# Patient Record
Sex: Female | Born: 1960 | Race: White | Hispanic: No | Marital: Single | Smoking: Never smoker
Health system: Southern US, Community
[De-identification: ages and names within clinical notes are randomized; demographics above are authoritative.]

## PROBLEM LIST (undated history)

## (undated) DIAGNOSIS — T7840XA Allergy, unspecified, initial encounter: Secondary | ICD-10-CM

## (undated) DIAGNOSIS — F329 Major depressive disorder, single episode, unspecified: Secondary | ICD-10-CM

## (undated) DIAGNOSIS — F419 Anxiety disorder, unspecified: Secondary | ICD-10-CM

## (undated) DIAGNOSIS — M1990 Unspecified osteoarthritis, unspecified site: Secondary | ICD-10-CM

## (undated) DIAGNOSIS — F32A Depression, unspecified: Secondary | ICD-10-CM

## (undated) DIAGNOSIS — D649 Anemia, unspecified: Secondary | ICD-10-CM

## (undated) DIAGNOSIS — H269 Unspecified cataract: Secondary | ICD-10-CM

## (undated) HISTORY — PX: POLYPECTOMY: SHX149

## (undated) HISTORY — DX: Major depressive disorder, single episode, unspecified: F32.9

## (undated) HISTORY — PX: MOLE REMOVAL: SHX2046

## (undated) HISTORY — DX: Unspecified cataract: H26.9

## (undated) HISTORY — DX: Anemia, unspecified: D64.9

## (undated) HISTORY — DX: Depression, unspecified: F32.A

## (undated) HISTORY — PX: NO PAST SURGERIES: SHX2092

## (undated) HISTORY — DX: Unspecified osteoarthritis, unspecified site: M19.90

## (undated) HISTORY — DX: Allergy, unspecified, initial encounter: T78.40XA

## (undated) HISTORY — PX: COLONOSCOPY: SHX174

## (undated) HISTORY — PX: WISDOM TOOTH EXTRACTION: SHX21

## (undated) HISTORY — DX: Anxiety disorder, unspecified: F41.9

---

## 2001-11-22 ENCOUNTER — Encounter: Payer: Self-pay | Admitting: Emergency Medicine

## 2001-11-22 ENCOUNTER — Encounter: Admission: RE | Admit: 2001-11-22 | Discharge: 2001-11-22 | Payer: Self-pay | Admitting: Emergency Medicine

## 2002-05-23 ENCOUNTER — Encounter: Payer: Self-pay | Admitting: Emergency Medicine

## 2002-05-23 ENCOUNTER — Encounter: Admission: RE | Admit: 2002-05-23 | Discharge: 2002-05-23 | Payer: Self-pay | Admitting: Emergency Medicine

## 2002-11-27 ENCOUNTER — Encounter: Admission: RE | Admit: 2002-11-27 | Discharge: 2002-11-27 | Payer: Self-pay | Admitting: Emergency Medicine

## 2002-11-27 ENCOUNTER — Encounter: Payer: Self-pay | Admitting: Emergency Medicine

## 2003-12-02 ENCOUNTER — Encounter: Admission: RE | Admit: 2003-12-02 | Discharge: 2003-12-02 | Payer: Self-pay | Admitting: Emergency Medicine

## 2005-01-18 ENCOUNTER — Encounter: Admission: RE | Admit: 2005-01-18 | Discharge: 2005-01-18 | Payer: Self-pay | Admitting: Emergency Medicine

## 2006-05-09 ENCOUNTER — Encounter: Admission: RE | Admit: 2006-05-09 | Discharge: 2006-05-09 | Payer: Self-pay | Admitting: Emergency Medicine

## 2007-07-13 ENCOUNTER — Encounter: Admission: RE | Admit: 2007-07-13 | Discharge: 2007-07-13 | Payer: Self-pay | Admitting: Emergency Medicine

## 2008-08-01 ENCOUNTER — Encounter: Admission: RE | Admit: 2008-08-01 | Discharge: 2008-08-01 | Payer: Self-pay | Admitting: Emergency Medicine

## 2009-10-29 ENCOUNTER — Encounter: Admission: RE | Admit: 2009-10-29 | Discharge: 2009-10-29 | Payer: Self-pay | Admitting: Emergency Medicine

## 2010-11-29 ENCOUNTER — Other Ambulatory Visit: Payer: Self-pay | Admitting: Emergency Medicine

## 2010-11-29 DIAGNOSIS — Z1231 Encounter for screening mammogram for malignant neoplasm of breast: Secondary | ICD-10-CM

## 2010-12-16 ENCOUNTER — Ambulatory Visit
Admission: RE | Admit: 2010-12-16 | Discharge: 2010-12-16 | Disposition: A | Discharge status: Home / Self Care | Payer: Self-pay | Source: Ambulatory Visit | Attending: Emergency Medicine | Admitting: Emergency Medicine

## 2010-12-16 DIAGNOSIS — Z1231 Encounter for screening mammogram for malignant neoplasm of breast: Secondary | ICD-10-CM

## 2010-12-20 ENCOUNTER — Other Ambulatory Visit: Payer: Self-pay | Admitting: Emergency Medicine

## 2010-12-20 DIAGNOSIS — R928 Other abnormal and inconclusive findings on diagnostic imaging of breast: Secondary | ICD-10-CM

## 2010-12-29 ENCOUNTER — Ambulatory Visit
Admission: RE | Admit: 2010-12-29 | Discharge: 2010-12-29 | Disposition: A | Discharge status: Home / Self Care | Payer: 59 | Source: Ambulatory Visit | Attending: Emergency Medicine | Admitting: Emergency Medicine

## 2010-12-29 DIAGNOSIS — R928 Other abnormal and inconclusive findings on diagnostic imaging of breast: Secondary | ICD-10-CM

## 2012-03-07 ENCOUNTER — Other Ambulatory Visit: Payer: Self-pay | Admitting: Emergency Medicine

## 2013-08-19 ENCOUNTER — Other Ambulatory Visit: Payer: Self-pay | Admitting: Family Medicine

## 2013-08-19 ENCOUNTER — Ambulatory Visit (INDEPENDENT_AMBULATORY_CARE_PROVIDER_SITE_OTHER): Payer: 59 | Admitting: Family Medicine

## 2013-08-19 VITALS — BP 118/80 | HR 61 | Temp 97.9°F | Resp 16 | Ht 66.75 in | Wt 191.0 lb

## 2013-08-19 DIAGNOSIS — Z1211 Encounter for screening for malignant neoplasm of colon: Secondary | ICD-10-CM

## 2013-08-19 DIAGNOSIS — D649 Anemia, unspecified: Secondary | ICD-10-CM

## 2013-08-19 DIAGNOSIS — Z0000 Encounter for general adult medical examination without abnormal findings: Secondary | ICD-10-CM

## 2013-08-19 DIAGNOSIS — Z23 Encounter for immunization: Secondary | ICD-10-CM

## 2013-08-19 LAB — POCT CBC
Granulocyte percent: 63.8 %G (ref 37–80)
HCT, POC: 32.8 % — AB (ref 37.7–47.9)
Hemoglobin: 9.9 g/dL — AB (ref 12.2–16.2)
Lymph, poc: 1.5 (ref 0.6–3.4)
MCH, POC: 22.3 pg — AB (ref 27–31.2)
MCHC: 30.2 g/dL — AB (ref 31.8–35.4)
MCV: 74 fL — AB (ref 80–97)
MID (cbc): 0.5 (ref 0–0.9)
MPV: 8.1 fL (ref 0–99.8)
POC Granulocyte: 3.4 (ref 2–6.9)
POC LYMPH PERCENT: 27.7 %L (ref 10–50)
POC MID %: 8.5 %M (ref 0–12)
Platelet Count, POC: 304 10*3/uL (ref 142–424)
RBC: 4.43 M/uL (ref 4.04–5.48)
RDW, POC: 17.4 %
WBC: 5.4 10*3/uL (ref 4.6–10.2)

## 2013-08-19 NOTE — Patient Instructions (Signed)

## 2013-08-19 NOTE — Progress Notes (Addendum)
Subjective:    Patient ID: Gust Brooms, female    DOB: 08-09-1960, 53 y.o.   MRN: [id number] This chart was scribed for Delman Cheadle, MD by Randa Evens, ED Scribe. This Patient was seen in room 13 and the patients care was started at 7:43 PM  Chief Complaint  Patient presents with  . Annual Exam    HPI HPI Comments: MCKAYLIE VASEY is a 53 y.o. female here for a physical.  States she is on Adderall and Leaxapro prescribed by Dr. Albertine Patricia - has seen him for many years.  States parents have h/o skin cancer. She does not have any moles she is concerned about, wears sunscreen, and checks her skin regularly. States father has a h/o Mild CVA at age of 39. Previous chloesterol levels  LDL 58 HDL 75 in 2003.  She does not smoke. She states she exercises 3-5 times a week. With resistance training and cardio for at least 30 min.  Last mammogram 12/2010.  States that she was not able to get a mammogram [facility] wouldn't let her schedule it. PCP is Dr. Everlene Farrier but has not seen him in > 3 yrs.  Her last tetanus shot was 2006.  She states that she is currently taking vitamin D, and calcium.  She denies any h/o abnormal pap smear. She states that she is now postmenopausal. States that she hasn't had menstrual cycle in 7 months. States she is experiencing hot flashes. She has never been sexually active with a female or female.    No past medical history on file. No current outpatient prescriptions on file prior to visit.   No current facility-administered medications on file prior to visit.   No Known Allergies History   Social History  . Marital Status: Single    Spouse Name: N/A    Number of Children: N/A  . Years of Education: N/A   Occupational History  . Not on file.   Social History Main Topics  . Smoking status: Never Smoker   . Smokeless tobacco: Not on file  . Alcohol Use: Not on file  . Drug Use: Not on file  . Sexual Activity: Not  on file   Other Topics Concern  . Not on file   Social History Narrative  . No narrative on file   No family history on file.   Review of Systems  All other systems reviewed and are negative.    Objective:  BP 118/80  Pulse 61  Temp(Src) 97.9 F (36.6 C) (Oral)  Resp 16  Ht 5' 6.75" (1.695 m)  Wt 191 lb (86.637 kg)  BMI 30.16 kg/m2  SpO2 100%  LMP 01/19/2013  Physical Exam  Nursing note and vitals reviewed. Constitutional: She is oriented to person, place, and time. She appears well-developed and well-nourished. No distress.  HENT:  Head: Normocephalic and atraumatic.  Right Ear: Tympanic membrane, external ear and ear canal normal.  Left Ear: Tympanic membrane, external ear and ear canal normal.  Nose: Nose normal. No rhinorrhea.  Mouth/Throat: Uvula is midline, oropharynx is clear and moist and mucous membranes are normal. No posterior oropharyngeal erythema.  Eyes: Conjunctivae and EOM are normal. Pupils are equal, round, and reactive to light. Right eye exhibits no discharge. Left eye exhibits no discharge. No scleral icterus.  Neck: Normal range of motion. Neck supple. No thyromegaly present.  Cardiovascular: Normal rate, regular rhythm, S1 normal, S2 normal, normal heart sounds and  intact distal pulses.   No murmur heard. Pulmonary/Chest: Effort normal and breath sounds normal. No respiratory distress.  Abdominal: Soft. Bowel sounds are normal. There is no tenderness.  Genitourinary: Vagina normal. No breast swelling, tenderness, discharge or bleeding. There is no rash, tenderness or lesion on the right labia. There is no rash, tenderness or lesion on the left labia.  Pt could not tolerate speculum exam. Quick bimanual exam with 1 finger allowed me to palpate anterior edge of cervix but only but pt could not tolerate additional vaginal exam due to pain.  Musculoskeletal: Normal range of motion. She exhibits no edema.  Lymphadenopathy:    She has no cervical  adenopathy.  Neurological: She is alert and oriented to person, place, and time. She has normal reflexes.  Skin: Skin is warm and dry. She is not diaphoretic. No erythema.  Psychiatric: Her speech is normal.   Results for orders placed in visit on 08/19/13  COMPREHENSIVE METABOLIC PANEL      Result Value Ref Range   Sodium 141  135 - 145 mEq/L   Potassium 4.2  3.5 - 5.3 mEq/L   Chloride 107  96 - 112 mEq/L   CO2 26  19 - 32 mEq/L   Glucose, Bld 89  70 - 99 mg/dL   BUN 15  6 - 23 mg/dL   Creat 0.65  0.50 - 1.10 mg/dL   Total Bilirubin 0.2  0.2 - 1.2 mg/dL   Alkaline Phosphatase 55  39 - 117 U/L   AST 22  0 - 37 U/L   ALT 20  0 - 35 U/L   Total Protein 6.0  6.0 - 8.3 g/dL   Albumin 3.9  3.5 - 5.2 g/dL   Calcium 9.3  8.4 - 10.5 mg/dL  LIPID PANEL      Result Value Ref Range   Cholesterol 163  0 - 200 mg/dL   Triglycerides 99  <150 mg/dL   HDL 60  >39 mg/dL   Total CHOL/HDL Ratio 2.7     VLDL 20  0 - 40 mg/dL   LDL Cholesterol 83  0 - 99 mg/dL  TSH      Result Value Ref Range   TSH 1.188  0.350 - 4.500 uIU/mL  POCT CBC      Result Value Ref Range   WBC 5.4  4.6 - 10.2 K/uL   Lymph, poc 1.5  0.6 - 3.4   POC LYMPH PERCENT 27.7  10 - 50 %L   MID (cbc) 0.5  0 - 0.9   POC MID % 8.5  0 - 12 %M   POC Granulocyte 3.4  2 - 6.9   Granulocyte percent 63.8  37 - 80 %G   RBC 4.43  4.04 - 5.48 M/uL   Hemoglobin 9.9 (*) 12.2 - 16.2 g/dL   HCT, POC 32.8 (*) 37.7 - 47.9 %   MCV 74.0 (*) 80 - 97 fL   MCH, POC 22.3 (*) 27 - 31.2 pg   MCHC 30.2 (*) 31.8 - 35.4 g/dL   RDW, POC 17.4     Platelet Count, POC 304  142 - 424 K/uL   MPV 8.1  0 - 99.8 fL  PAP IG AND HPV HIGH-RISK      Result Value Ref Range   HPV DNA High Risk Not Detected     Specimen adequacy:       FINAL DIAGNOSIS:       COMMENTS:  Cytotechnologist:        Assessment & Plan:   Routine general medical examination at a health care facility - Plan: POCT CBC, Comprehensive metabolic panel, Lipid panel, TSH, Pap IG  and HPV (high risk) DNA detection, MM Digital Screening, Tdap vaccine greater than or equal to 7yo IM, CANCELED: Pap IG and HPV (high risk) DNA detection - [facility] now that she has had a CPE. Could not visualize cervix due to narrow vaginal introitus and complete intolerance of speculum exam.  I tried to do bimanual exam with only 1 finger but could only palpate anterior lip of cervix due to it's high location and pt's pain with exam - blind pap collected with brush only - not speculum. However, discussed in detail w/ pt that the importance of cervical cancer screening for her is much reduced as she has never been sexually active so is highly unlikely to have contracted a high risk HPV lesion. If HPV test returns negative - I do think it would be safe for pt to forgo additional cervical cancer screening at this time.  If pt has any questions, concerns, or would like a second opinion I would be happy to refer her to gyn - they also have much smaller speculums that she might be able to tolerate better - would suggest pt see Elon Alas at [facility]. Warned pt that if she has any additional vaginal bleeding - she will need to be seen and examined for this. Reviewed signs/sxs of ovarian and uterine cancer.  Special screening for malignant neoplasms, colon - Plan: Ambulatory referral to Gastroenterology - needs initial screening colonoscopy.  Meds ordered this encounter  Medications  . loratadine (CLARITIN) 10 MG tablet    Sig: Take 10 mg by mouth daily.  Marland Kitchen amphetamine-dextroamphetamine (ADDERALL) 20 MG tablet    Sig: Take 20 mg by mouth daily.  Marland Kitchen escitalopram (LEXAPRO) 5 MG tablet    Sig: Take 5 mg by mouth daily.    I personally performed the services described in this documentation, which was scribed in my presence. The recorded information has been reviewed and considered, and addended by me as needed.  Delman Cheadle, MD MPH

## 2013-08-20 LAB — COMPREHENSIVE METABOLIC PANEL
ALT: 20 U/L (ref 0–35)
AST: 22 U/L (ref 0–37)
Albumin: 3.9 g/dL (ref 3.5–5.2)
Alkaline Phosphatase: 55 U/L (ref 39–117)
BUN: 15 mg/dL (ref 6–23)
CO2: 26 mEq/L (ref 19–32)
Calcium: 9.3 mg/dL (ref 8.4–10.5)
Chloride: 107 mEq/L (ref 96–112)
Creat: 0.65 mg/dL (ref 0.50–1.10)
Glucose, Bld: 89 mg/dL (ref 70–99)
Potassium: 4.2 mEq/L (ref 3.5–5.3)
Sodium: 141 mEq/L (ref 135–145)
Total Bilirubin: 0.2 mg/dL (ref 0.2–1.2)
Total Protein: 6 g/dL (ref 6.0–8.3)

## 2013-08-20 LAB — LIPID PANEL
Cholesterol: 163 mg/dL (ref 0–200)
HDL: 60 mg/dL (ref >39)
LDL Cholesterol: 83 mg/dL (ref 0–99)
Total CHOL/HDL Ratio: 2.7 Ratio
Triglycerides: 99 mg/dL (ref <150)
VLDL: 20 mg/dL (ref 0–40)

## 2013-08-20 LAB — TSH: TSH: 1.188 u[IU]/mL (ref 0.350–4.500)

## 2013-08-22 LAB — PAP IG AND HPV HIGH-RISK: HPV DNA High Risk: NOT DETECTED

## 2013-08-22 LAB — FERRITIN: Ferritin: 4 ng/mL — ABNORMAL LOW (ref 10–291)

## 2013-08-27 ENCOUNTER — Encounter: Payer: Self-pay | Admitting: Gastroenterology

## 2013-08-27 ENCOUNTER — Other Ambulatory Visit: Payer: Self-pay | Admitting: Family Medicine

## 2013-08-27 DIAGNOSIS — Z1231 Encounter for screening mammogram for malignant neoplasm of breast: Secondary | ICD-10-CM

## 2013-09-04 ENCOUNTER — Ambulatory Visit
Admission: RE | Admit: 2013-09-04 | Discharge: 2013-09-04 | Disposition: A | Discharge status: Home / Self Care | Payer: 59 | Source: Ambulatory Visit | Attending: Family Medicine | Admitting: Family Medicine

## 2013-09-04 DIAGNOSIS — Z1231 Encounter for screening mammogram for malignant neoplasm of breast: Secondary | ICD-10-CM

## 2013-10-01 ENCOUNTER — Ambulatory Visit (AMBULATORY_SURGERY_CENTER): Payer: Self-pay

## 2013-10-01 VITALS — Ht 65.5 in | Wt 186.8 lb

## 2013-10-01 DIAGNOSIS — Z1211 Encounter for screening for malignant neoplasm of colon: Secondary | ICD-10-CM

## 2013-10-01 MED ORDER — SUPREP BOWEL PREP KIT 17.5-3.13-1.6 GM/177ML PO SOLN: 1.0000 | Freq: Once | ORAL | Status: DC

## 2013-10-01 NOTE — Progress Notes (Signed)
No allergies to eggs or soy No home oxygen No diet/weight loss meds No past exposure to anesthesia  Has email  Emmi instructions given for colonoscopy 

## 2013-10-14 ENCOUNTER — Ambulatory Visit (AMBULATORY_SURGERY_CENTER): Payer: 59 | Admitting: Gastroenterology

## 2013-10-14 ENCOUNTER — Encounter: Payer: Self-pay | Admitting: Gastroenterology

## 2013-10-14 VITALS — BP 116/72 | HR 42 | Temp 97.2°F | Resp 15 | Ht 65.0 in | Wt 186.0 lb

## 2013-10-14 DIAGNOSIS — Z1211 Encounter for screening for malignant neoplasm of colon: Secondary | ICD-10-CM

## 2013-10-14 DIAGNOSIS — D126 Benign neoplasm of colon, unspecified: Secondary | ICD-10-CM

## 2013-10-14 DIAGNOSIS — K5730 Diverticulosis of large intestine without perforation or abscess without bleeding: Secondary | ICD-10-CM

## 2013-10-14 MED ORDER — SODIUM CHLORIDE 0.9 % IV SOLN: 500.0000 mL | INTRAVENOUS | Status: DC

## 2013-10-14 NOTE — Patient Instructions (Signed)
YOU HAD AN ENDOSCOPIC PROCEDURE TODAY AT THE [facility]: Refer to the procedure report that was given to you for any specific questions about what was found during the examination.  If the procedure report does not answer your questions, please call your gastroenterologist to clarify.  If you requested that your care partner not be given the details of your procedure findings, then the procedure report has been included in a sealed envelope for you to review at your convenience later.  YOU SHOULD EXPECT: Some feelings of bloating in the abdomen. Passage of more gas than usual.  Walking can help get rid of the air that was put into your GI tract during the procedure and reduce the bloating. If you had a lower endoscopy (such as a colonoscopy or flexible sigmoidoscopy) you may notice spotting of blood in your stool or on the toilet paper. If you underwent a bowel prep for your procedure, then you may not have a normal bowel movement for a few days.  DIET: Your first meal following the procedure should be a light meal and then it is ok to progress to your normal diet.  A half-sandwich or bowl of soup is an example of a good first meal.  Heavy or fried foods are harder to digest and may make you feel nauseous or bloated.  Likewise meals heavy in dairy and vegetables can cause extra gas to form and this can also increase the bloating.  Drink plenty of fluids but you should avoid alcoholic beverages for 24 hours.  ACTIVITY: Your care partner should take you home directly after the procedure.  You should plan to take it easy, moving slowly for the rest of the day.  You can resume normal activity the day after the procedure however you should NOT DRIVE or use heavy machinery for 24 hours (because of the sedation medicines used during the test).    SYMPTOMS TO REPORT IMMEDIATELY: A gastroenterologist can be reached at any hour.  During normal business hours, 8:30 AM to 5:00 PM Monday through Friday,  call [phone].  After hours and on weekends, please call the GI answering service at [phone] who will take a message and have the physician on call contact you.   Following lower endoscopy (colonoscopy or flexible sigmoidoscopy):  Excessive amounts of blood in the stool  Significant tenderness or worsening of abdominal pains  Swelling of the abdomen that is new, acute  Fever of 100F or higher   FOLLOW UP: If any biopsies were taken you will be contacted by phone or by letter within the next 1-3 weeks.  Call your gastroenterologist if you have not heard about the biopsies in 3 weeks.  Our staff will call the home number listed on your records the next business day following your procedure to check on you and address any questions or concerns that you may have at that time regarding the information given to you following your procedure. This is a courtesy call and so if there is no answer at the home number and we have not heard from you through the emergency physician on call, we will assume that you have returned to your regular daily activities without incident.  SIGNATURES/CONFIDENTIALITY: You and/or your care partner have signed paperwork which will be entered into your electronic medical record.  These signatures attest to the fact that that the information above on your After Visit Summary has been reviewed and is understood.  Full responsibility of the confidentiality of   this discharge information lies with you and/or your care-partner.   Resume medications. Information given on polyps,diverticulosis and high fiber diet with discharge instructions. 

## 2013-10-14 NOTE — Progress Notes (Signed)
Called to room to assist during endoscopic procedure.  Patient ID and intended procedure confirmed with present staff. Received instructions for my participation in the procedure from the performing physician.  

## 2013-10-14 NOTE — Progress Notes (Signed)
Report to PACU, RN, vss, BBS= Clear.  

## 2013-10-14 NOTE — Op Note (Signed)
[place]  [address]. [place], [address]   COLONOSCOPY PROCEDURE REPORT  PATIENT: Natasha, Rogers  MR#: [id number] BIRTHDATE: April 15, 1960 , 53  yrs. old GENDER: Female ENDOSCOPIST: Inda Castle, MD REFERRED LK:[id number] Everlene Farrier, M.D. PROCEDURE DATE:  10/14/2013 PROCEDURE:   Colonoscopy with snare polypectomy First Screening Colonoscopy - Avg.  risk and is 50 yrs.  old or older Yes.  Prior Negative Screening - Now for repeat screening. N/A  History of Adenoma - Now for follow-up colonoscopy & has been > or = to 3 yrs.  N/A  Polyps Removed Today? Yes. ASA CLASS:   Class II INDICATIONS:average risk screening. MEDICATIONS: MAC sedation, administered by CRNA and propofol (Diprivan) 300mg  IV  DESCRIPTION OF PROCEDURE:   After the risks benefits and alternatives of the procedure were thoroughly explained, informed consent was obtained.  A digital rectal exam revealed no abnormalities of the rectum.   The     endoscope was introduced through the anus and advanced to the cecum, which was identified by both the appendix and ileocecal valve. No adverse events experienced.   The quality of the prep was excellent using Suprep The instrument was then slowly withdrawn as the colon was fully examined.      COLON FINDINGS: A flat polyp measuring 3 mm in size was found in the distal sigmoid colon.  A polypectomy was performed with a cold snare.  The resection was complete and the polyp tissue was completely retrieved.   Mild diverticulosis was noted in the sigmoid colon.   The colon was otherwise normal.  There was no diverticulosis, inflammation, polyps or cancers unless previously stated.  Retroflexed views revealed no abnormalities. The time to cecum=4 minutes 53 seconds.  Withdrawal time=10 minutes 37 seconds. The scope was withdrawn and the procedure completed. COMPLICATIONS: There were no complications.  ENDOSCOPIC IMPRESSION: 1.   Flat polyp measuring 3 mm in  size was found in the distal sigmoid colon; polypectomy was performed with a cold snare 2.   Mild diverticulosis was noted in the sigmoid colon 3.   The colon was otherwise normal  RECOMMENDATIONS: If the polyp(s) removed today are proven to be adenomatous (pre-cancerous) polyps, you will need a repeat colonoscopy in 5 years.  Otherwise you should continue to follow colorectal cancer screening guidelines for "routine risk" patients with colonoscopy in 10 years.  You will receive a letter within 1-2 weeks with the results of your biopsy as well as final recommendations.  Please call my office if you have not received a letter after 3 weeks.   eSigned:  Inda Castle, MD 10/14/2013 2:02 PM   cc:   PATIENT NAME:  Natasha, Rogers MR#: [id number]

## 2013-10-15 ENCOUNTER — Telehealth: Payer: Self-pay | Admitting: *Deleted

## 2013-10-15 NOTE — Telephone Encounter (Signed)
  Follow up Call-  Call back number 10/14/2013  Post procedure Call Back phone  # [phone]  Permission to leave phone message Yes     Patient questions:  Do you have a fever, pain , or abdominal swelling? No. Pain Score  0 *  Have you tolerated food without any problems? Yes.    Have you been able to return to your normal activities? Yes.    Do you have any questions about your discharge instructions: Diet   No. Medications  No. Follow up visit  No.  Do you have questions or concerns about your Care? No.  Actions: * If pain score is 4 or above: No action needed, pain <4.

## 2013-10-17 ENCOUNTER — Encounter: Payer: Self-pay | Admitting: Gastroenterology

## 2014-05-30 ENCOUNTER — Ambulatory Visit (INDEPENDENT_AMBULATORY_CARE_PROVIDER_SITE_OTHER): Payer: 59 | Admitting: Family Medicine

## 2014-05-30 VITALS — BP 120/80 | HR 58 | Temp 97.5°F | Resp 16 | Ht 66.0 in | Wt 193.8 lb

## 2014-05-30 DIAGNOSIS — J040 Acute laryngitis: Secondary | ICD-10-CM | POA: Diagnosis not present

## 2014-05-30 NOTE — Progress Notes (Signed)
[facility] [address] [place] [address] [phone]  Date:  05/30/2014   Name:  SUMMAR MCGLOTHLIN   DOB:  June 17, 1960   MRN:  [id number]  PCP:  Jenny Reichmann, MD    Chief Complaint: Laryngitis and Cough   History of Present Illness:  Natasha Rogers is a 54 y.o. very pleasant female patient who presents with the following:  She "feel fine but I just don't have a voice."  This is a problem with her job- she has to get documentation of any sick time. She missed just today- she will be off over the weekend and thinks she can return on Monday (todya is Friday).  She works at a call center No ST, no fever, no cough- feels well OW She is generally in good health  There are no active problems to display for this patient.   Past Medical History  Diagnosis Date  . Depression   . Anxiety   . Allergy     Past Surgical History  Procedure Laterality Date  . No past surgeries      History  Substance Use Topics  . Smoking status: Never Smoker   . Smokeless tobacco: Never Used  . Alcohol Use: Yes     Comment: once monthly    Family History  Problem Relation Age of Onset  . Colon cancer Neg Hx   . Pancreatic cancer Neg Hx   . Rectal cancer Neg Hx   . Stomach cancer Neg Hx   . Heart disease Father     No Known Allergies  Medication list has been reviewed and updated.  Current Outpatient Prescriptions on File Prior to Visit  Medication Sig Dispense Refill  . amphetamine-dextroamphetamine (ADDERALL) 20 MG tablet Take 20 mg by mouth daily.    Marland Kitchen escitalopram (LEXAPRO) 5 MG tablet Take 5 mg by mouth daily.    Marland Kitchen loratadine (CLARITIN) 10 MG tablet Take 10 mg by mouth daily.    . Misc Natural Products (ESTROVEN + ENERGY MAX STRENGTH) TABS Take by mouth.     No current facility-administered medications on file prior to visit.    Review of Systems:  As per HPI- otherwise negative.   Physical Examination: Filed Vitals:   05/30/14 1423  BP: 120/80   Pulse: 58  Temp: 97.5 F (36.4 C)  Resp: 16   Filed Vitals:   05/30/14 1423  Height: 5\' 6"  (1.676 m)  Weight: 193 lb 12.8 oz (87.907 kg)   Body mass index is 31.3 kg/(m^2). Ideal Body Weight: Weight in (lb) to have BMI = 25: 154.6  GEN: WDWN, NAD, Non-toxic, A & O x 3, looks well, hoarse voice HEENT: Atraumatic, Normocephalic. Neck supple. No masses, No LAD.  Bilateral TM wnl, oropharynx normal.  PEERL,EOMI.   Ears and Nose: No external deformity. CV: RRR, No M/G/R. No JVD. No thrill. No extra heart sounds. PULM: CTA B, no wheezes, crackles, rhonchi. No retractions. No resp. distress. No accessory muscle use. EXTR: No c/c/e NEURO Normal gait.  PSYCH: Normally interactive. Conversant. Not depressed or anxious appearing.  Calm demeanor.    Assessment and Plan: Laryngitis  No evidence of more serious illness.  She will rest her voice, use honey and lemon, and will let me know if not better soon Did note for her job as requested   Signed Lamar Blinks, MD

## 2014-05-30 NOTE — Patient Instructions (Signed)
Good to see you today- I hope that you feel better soon! Rest your voice as best as you can, and drink plenty of liquids.   If you are not back to normal in a few days please let me know, sooner if you get worse.   If you need paperwork for your job let me know

## 2014-12-23 ENCOUNTER — Encounter: Payer: Self-pay | Admitting: Emergency Medicine

## 2015-01-13 ENCOUNTER — Other Ambulatory Visit: Payer: Self-pay

## 2015-01-13 DIAGNOSIS — Z1231 Encounter for screening mammogram for malignant neoplasm of breast: Secondary | ICD-10-CM

## 2015-02-17 ENCOUNTER — Ambulatory Visit
Admission: RE | Admit: 2015-02-17 | Discharge: 2015-02-17 | Disposition: A | Discharge status: Home / Self Care | Payer: 59 | Source: Ambulatory Visit

## 2015-02-17 DIAGNOSIS — Z1231 Encounter for screening mammogram for malignant neoplasm of breast: Secondary | ICD-10-CM

## 2016-05-10 ENCOUNTER — Other Ambulatory Visit: Payer: Self-pay | Admitting: Family Medicine

## 2016-05-10 DIAGNOSIS — Z1231 Encounter for screening mammogram for malignant neoplasm of breast: Secondary | ICD-10-CM

## 2016-05-30 ENCOUNTER — Ambulatory Visit: Payer: 59

## 2016-06-21 ENCOUNTER — Ambulatory Visit
Admission: RE | Admit: 2016-06-21 | Discharge: 2016-06-21 | Disposition: A | Discharge status: Home / Self Care | Payer: 59 | Source: Ambulatory Visit | Attending: Family Medicine | Admitting: Family Medicine

## 2016-06-21 DIAGNOSIS — Z1231 Encounter for screening mammogram for malignant neoplasm of breast: Secondary | ICD-10-CM

## 2016-11-01 ENCOUNTER — Emergency Department (HOSPITAL_COMMUNITY)
Admission: EM | Admit: 2016-11-01 | Discharge: 2016-11-01 | Disposition: A | Discharge status: Home / Self Care | Payer: 59 | Attending: Emergency Medicine | Admitting: Emergency Medicine

## 2016-11-01 ENCOUNTER — Encounter (HOSPITAL_COMMUNITY): Payer: Self-pay | Admitting: Emergency Medicine

## 2016-11-01 DIAGNOSIS — R55 Syncope and collapse: Secondary | ICD-10-CM | POA: Diagnosis present

## 2016-11-01 DIAGNOSIS — D649 Anemia, unspecified: Secondary | ICD-10-CM | POA: Diagnosis not present

## 2016-11-01 DIAGNOSIS — Z79899 Other long term (current) drug therapy: Secondary | ICD-10-CM | POA: Diagnosis not present

## 2016-11-01 LAB — CBG MONITORING, ED: GLUCOSE-CAPILLARY: 88 mg/dL (ref 65–99)

## 2016-11-01 LAB — TROPONIN I: Troponin I: <0.03 ng/mL (ref <0.03)

## 2016-11-01 LAB — URINALYSIS, ROUTINE W REFLEX MICROSCOPIC
BILIRUBIN URINE: NEGATIVE
Glucose, UA: NEGATIVE mg/dL
HGB URINE DIPSTICK: NEGATIVE
Ketones, ur: NEGATIVE mg/dL
Leukocytes, UA: NEGATIVE
Nitrite: NEGATIVE
PROTEIN: NEGATIVE mg/dL
Specific Gravity, Urine: 1.005 (ref 1.005–1.030)
pH: 6 (ref 5.0–8.0)

## 2016-11-01 LAB — CBC
HCT: 29.3 % — ABNORMAL LOW (ref 36.0–46.0)
Hemoglobin: 8.6 g/dL — ABNORMAL LOW (ref 12.0–15.0)
MCH: 19.1 pg — AB (ref 26.0–34.0)
MCHC: 29.4 g/dL — AB (ref 30.0–36.0)
MCV: 65.1 fL — AB (ref 78.0–100.0)
PLATELETS: 386 10*3/uL (ref 150–400)
RBC: 4.5 MIL/uL (ref 3.87–5.11)
RDW: 17.4 % — ABNORMAL HIGH (ref 11.5–15.5)
WBC: 6.5 10*3/uL (ref 4.0–10.5)

## 2016-11-01 LAB — BASIC METABOLIC PANEL
Anion gap: 7 (ref 5–15)
BUN: 14 mg/dL (ref 6–20)
CALCIUM: 8.7 mg/dL — AB (ref 8.9–10.3)
CHLORIDE: 103 mmol/L (ref 101–111)
CO2: 25 mmol/L (ref 22–32)
CREATININE: 0.77 mg/dL (ref 0.44–1.00)
GFR calc Af Amer: >60 mL/min (ref >60)
GFR calc non Af Amer: >60 mL/min (ref >60)
GLUCOSE: 97 mg/dL (ref 65–99)
Potassium: 3.4 mmol/L — ABNORMAL LOW (ref 3.5–5.1)
Sodium: 135 mmol/L (ref 135–145)

## 2016-11-01 LAB — POC OCCULT BLOOD, ED: FECAL OCCULT BLD: NEGATIVE

## 2016-11-01 LAB — D-DIMER, QUANTITATIVE: D-Dimer, Quant: 0.38 ug/mL-FEU (ref 0.00–0.50)

## 2016-11-01 NOTE — ED Provider Notes (Signed)
[facility] DEPT Provider Note   CSN: [id number] Arrival date & time: 11/01/16  1923     History   Chief Complaint Chief Complaint  Patient presents with  . Loss of Consciousness    HPI Natasha Rogers is a 56 y.o. female.  HPI   Natasha Rogers is a 56 y.o. female with a history of anxiety and depression, who presents via EMS for evaluation of a syncopal episode. Patient reports she did and treadmill workout earlier in the day and then went to work and started experiencing a muscle spasm in the back of her right thigh. She reports intense pain and twitching sensation in the back of her leg. Patient was seated at her desk and reports she then started to feel hot, sweaty, dizzy and nauseous. A coworker told her that her head fell to the desk and she seemed to lose consciousness. EMS was called by coworker. Patient is unsure how long she was unconscious. Patient denies any chest pain or palpitations preceding the event, and denies current chest pain, palpitations or shortness of breath. Patient now reports mild muscle soreness in right thigh and fatigue. Patient denies any weakness, numbness, vision changes or headache.   Patient denies any personal cardiac history and denies family history of cardiac events, arrhythmias or sudden death. Patient currently takes Adderall, and also reports drinking caffeine and taking caffeine supplements daily. She reports she has not had any water or much to eat today, which may have contributed.   Past Medical History:  Diagnosis Date  . Allergy   . Anxiety   . Depression     There are no active problems to display for this patient.   Past Surgical History:  Procedure Laterality Date  . NO PAST SURGERIES      OB History    No data available       Home Medications    Prior to Admission medications   Medication Sig Start Date End Date Taking? Authorizing Provider  amphetamine-dextroamphetamine (ADDERALL) 20 MG tablet Take 20 mg by  mouth daily.    [provider]  escitalopram (LEXAPRO) 5 MG tablet Take 5 mg by mouth daily.    [provider]  loratadine (CLARITIN) 10 MG tablet Take 10 mg by mouth daily.    [provider]  Misc Natural Products (ESTROVEN + ENERGY MAX STRENGTH) TABS Take by mouth.    [provider]    Family History Family History  Problem Relation Age of Onset  . Heart disease Father   . Breast cancer Paternal Aunt   . Breast cancer Paternal Grandmother   . Colon cancer Neg Hx   . Pancreatic cancer Neg Hx   . Rectal cancer Neg Hx   . Stomach cancer Neg Hx     Social History Social History  Substance Use Topics  . Smoking status: Never Smoker  . Smokeless tobacco: Never Used  . Alcohol use Yes     Comment: once monthly     Allergies   Patient has no known allergies.   Review of Systems Review of Systems  Constitutional: Positive for diaphoresis and fatigue. Negative for appetite change, chills and fever.  Eyes: Negative for photophobia and visual disturbance.  Respiratory: Negative for chest tightness and shortness of breath.   Cardiovascular: Negative for chest pain, palpitations and leg swelling.  Gastrointestinal: Negative for abdominal pain, nausea and vomiting.  Neurological: Positive for dizziness and syncope. Negative for tremors, speech difficulty, weakness, light-headedness, numbness and  headaches.  All other systems reviewed and are negative.    Physical Exam Updated Vital Signs BP 114/69 (BP Location: Right Arm)   Pulse (!) 53   Temp 97.8 F (36.6 C) (Oral)   Resp 16   LMP 01/19/2013   SpO2 100%   Physical Exam  Constitutional: She is oriented to person, place, and time. She appears well-developed and well-nourished. No distress.  HENT:  Head: Normocephalic and atraumatic.  Eyes: Pupils are equal, round, and reactive to light. EOM are normal.  Neck: Neck supple.  Cardiovascular: Normal rate, regular rhythm and normal  heart sounds.   Pulmonary/Chest: Effort normal and breath sounds normal.  Abdominal: Soft. Bowel sounds are normal.  Neurological: She is alert and oriented to person, place, and time. No cranial nerve deficit.  Skin: Skin is warm and dry. She is not diaphoretic. No pallor.  Psychiatric: She has a normal mood and affect.  Nursing note and vitals reviewed.    ED Treatments / Results  Labs (all labs ordered are listed, but only abnormal results are displayed) Labs Reviewed  BASIC METABOLIC PANEL - Abnormal; Notable for the following:       Result Value   Potassium 3.4 (*)    Calcium 8.7 (*)    All other components within normal limits  CBC - Abnormal; Notable for the following:    Hemoglobin 8.6 (*)    HCT 29.3 (*)    MCV 65.1 (*)    MCH 19.1 (*)    MCHC 29.4 (*)    RDW 17.4 (*)    All other components within normal limits  TROPONIN I  D-DIMER, QUANTITATIVE (NOT AT [facility])  URINALYSIS, ROUTINE W REFLEX MICROSCOPIC  CBG MONITORING, ED  POC OCCULT BLOOD, ED    EKG  EKG Interpretation  Date/Time:  Tuesday November 01 2016 19:41:58 EDT Ventricular Rate:  56 PR Interval:  156 QRS Duration: 90 QT Interval:  514 QTC Calculation: 496 R Axis:   20 Text Interpretation:  Sinus bradycardia Possible Left atrial enlargement Borderline ECG No prior ECG for comparison.  No STEMI Confirmed by Antony Blackbird [phone]) on 11/01/2016 8:19:07 PM     .  Radiology No results found.  Procedures Procedures (including critical care time)  Medications Ordered in ED Medications - No data to display   Initial Impression / Assessment and Plan / ED Course  I have reviewed the triage vital signs and the nursing notes.  Pertinent labs & imaging results that were available during my care of the patient were reviewed by me and considered in my medical decision making (see chart for details).  8:04 PM Patient seen and examined, here after syncopal episode at work, likely vasovagal but will also  consider cardiac including PE or neurological etiologies. VSS and denying current CP, SOB, dizziness or nausea. Triage EKG showed sinus brady with no evidence of ischemia. CBG 88 in triage. CBC, CMP, and UA pending. Will order orthostatics, Troponin and D-Dimer.  9:07 PM Othostatics negative. CBC shows Hgb 8.6, previously 9.9 in 2015, seems to be chronic problem, patient reports she has taken Iron supplements in the past. Pt denies dark or bloody stools. Will obtain hemoccult to rule out GI bleed.  9:54 PM D-dimer and Troponin both WNL, making ACS or PE unlikely. BMP unremarkable and fecal hemoccult negative. UA is still pending, but patient denies any urinary symptoms. Patient continues to deny any symptoms of dizziness, CP or palpitations and reports she is feeling better. Patient able to  ambulate to bathroom without symptoms, VSS. Syncope most likely vasovagal in response to pain. Discussed plans for discharge with patient and she is in agreement and feels comfortable with going home. Return to ED if symptoms reoccur.  Vitals:   11/01/16 1945  BP: 114/69  Pulse: (!) 53  Resp: 16  Temp: 97.8 F (36.6 C)  SpO2: 100%      Final Clinical Impressions(s) / ED Diagnoses   Final diagnoses:  Syncope, unspecified syncope type  Anemia, unspecified type    New Prescriptions New Prescriptions   No medications on file     Janet Berlin 11/01/16 2219    Tegeler, Gwenyth Allegra, MD 11/02/16 1244

## 2016-11-01 NOTE — Discharge Instructions (Signed)
Your blood work showed that you hemoglobin is low, restart iron supplements you were previously taking. Follow up with your doctor for continued monitoring of syncope and recheck of hemoglobin. Return to ED if symptoms recur.

## 2016-11-01 NOTE — ED Triage Notes (Signed)
Pt arrives via EMS from work with syncopal episode. Reports working out today, hasn't been eating well. Increased stress per patient. No IV access. BP 121/73, HR 70. Pt alert at this time.

## 2017-12-01 ENCOUNTER — Other Ambulatory Visit: Payer: Self-pay | Admitting: Family Medicine

## 2017-12-01 DIAGNOSIS — Z1231 Encounter for screening mammogram for malignant neoplasm of breast: Secondary | ICD-10-CM

## 2017-12-29 ENCOUNTER — Ambulatory Visit
Admission: RE | Admit: 2017-12-29 | Discharge: 2017-12-29 | Disposition: A | Discharge status: Home / Self Care | Payer: 59 | Source: Ambulatory Visit | Attending: Family Medicine | Admitting: Family Medicine

## 2017-12-29 DIAGNOSIS — Z1231 Encounter for screening mammogram for malignant neoplasm of breast: Secondary | ICD-10-CM

## 2018-01-01 ENCOUNTER — Encounter: Payer: Self-pay | Admitting: Family Medicine

## 2018-12-21 ENCOUNTER — Encounter: Payer: Self-pay | Admitting: Gastroenterology

## 2019-07-02 ENCOUNTER — Other Ambulatory Visit: Payer: Self-pay | Admitting: Family Medicine

## 2019-07-02 DIAGNOSIS — Z1231 Encounter for screening mammogram for malignant neoplasm of breast: Secondary | ICD-10-CM

## 2019-07-05 ENCOUNTER — Other Ambulatory Visit: Payer: Self-pay

## 2019-07-05 ENCOUNTER — Ambulatory Visit
Admission: RE | Admit: 2019-07-05 | Discharge: 2019-07-05 | Disposition: A | Discharge status: Home / Self Care | Payer: BC Managed Care – PPO | Source: Ambulatory Visit

## 2019-07-05 DIAGNOSIS — Z1231 Encounter for screening mammogram for malignant neoplasm of breast: Secondary | ICD-10-CM

## 2019-11-29 ENCOUNTER — Encounter: Payer: Self-pay | Admitting: Gastroenterology

## 2020-01-31 ENCOUNTER — Telehealth: Payer: Self-pay | Admitting: Gastroenterology

## 2020-01-31 ENCOUNTER — Other Ambulatory Visit: Payer: Self-pay

## 2020-01-31 ENCOUNTER — Ambulatory Visit (AMBULATORY_SURGERY_CENTER): Payer: Self-pay | Admitting: *Deleted

## 2020-01-31 VITALS — Ht 66.0 in | Wt 177.0 lb

## 2020-01-31 DIAGNOSIS — Z86010 Personal history of colonic polyps: Secondary | ICD-10-CM

## 2020-01-31 MED ORDER — SUTAB 1479-225-188 MG PO TABS: 24.0000 | ORAL_TABLET | ORAL | 0 refills | Status: DC

## 2020-01-31 NOTE — Progress Notes (Signed)
cov vax x 2  No egg or soy allergy known to patient  No issues with past sedation with any surgeries or procedures no intubation problems in the past  No FH of Malignant Hyperthermia No diet pills per patient No home 02 use per patient  No blood thinners per patient  Pt denies issues with constipation  No A fib or A flutter  EMMI video to pt or via [place] 19 guidelines implemented in PV today with Pt and RN   sutab  Coupon given to pt in PV today , Code to Pharmacy   Due to the COVID-19 pandemic we are asking patients to follow these guidelines. Please only bring one care partner. Please be aware that your care partner may wait in the car in the parking lot or if they feel like they will be too hot to wait in the car, they may wait in the lobby on the 4th floor. All care partners are required to wear a mask the entire time (we do not have any that we can provide them), they need to practice social distancing, and we will do a Covid check for all patient's and care partners when you arrive. Also we will check their temperature and your temperature. If the care partner waits in their car they need to stay in the parking lot the entire time and we will call them on their cell phone when the patient is ready for discharge so they can bring the car to the front of the building. Also all patient's will need to wear a mask into building.

## 2020-01-31 NOTE — Telephone Encounter (Signed)
Please route to the appropriate person.

## 2020-02-03 MED ORDER — SUTAB 1479-225-188 MG PO TABS: 1.0000 | ORAL_TABLET | ORAL | 0 refills | Status: DC

## 2020-02-03 NOTE — Telephone Encounter (Signed)
[facility] per pt's [facility]. Patient called no answer, left a message.

## 2020-02-04 ENCOUNTER — Encounter: Payer: Self-pay | Admitting: Gastroenterology

## 2020-02-17 ENCOUNTER — Other Ambulatory Visit: Payer: Self-pay

## 2020-02-17 ENCOUNTER — Ambulatory Visit (AMBULATORY_SURGERY_CENTER): Payer: BC Managed Care – PPO | Admitting: Gastroenterology

## 2020-02-17 ENCOUNTER — Encounter: Payer: Self-pay | Admitting: Gastroenterology

## 2020-02-17 VITALS — BP 122/86 | HR 59 | Temp 97.9°F | Resp 19 | Ht 66.0 in | Wt 177.0 lb

## 2020-02-17 DIAGNOSIS — D120 Benign neoplasm of cecum: Secondary | ICD-10-CM | POA: Diagnosis not present

## 2020-02-17 DIAGNOSIS — Z86010 Personal history of colonic polyps: Secondary | ICD-10-CM

## 2020-02-17 MED ORDER — SODIUM CHLORIDE 0.9 % IV SOLN: 500.0000 mL | Freq: Once | INTRAVENOUS | Status: DC

## 2020-02-17 NOTE — Progress Notes (Signed)
AR - Check-in  CW - VS  Pt's states no medical or surgical changes since previsit or office visit. 

## 2020-02-17 NOTE — Progress Notes (Signed)
Called to room to assist during endoscopic procedure.  Patient ID and intended procedure confirmed with present staff. Received instructions for my participation in the procedure from the performing physician.  

## 2020-02-17 NOTE — Patient Instructions (Signed)
Handouts provided on polyps, diverticulosis and high-fiber diet.   Follow a high fiber diet. Drink at least 64 ounces of water daily. Add a daily stool bulking agent such as psyllium (an example would be Metamucil).  YOU HAD AN ENDOSCOPIC PROCEDURE TODAY AT [facility]:   Refer to the procedure report that was given to you for any specific questions about what was found during the examination.  If the procedure report does not answer your questions, please call your gastroenterologist to clarify.  If you requested that your care partner not be given the details of your procedure findings, then the procedure report has been included in a sealed envelope for you to review at your convenience later.  YOU SHOULD EXPECT: Some feelings of bloating in the abdomen. Passage of more gas than usual.  Walking can help get rid of the air that was put into your GI tract during the procedure and reduce the bloating. If you had a lower endoscopy (such as a colonoscopy or flexible sigmoidoscopy) you may notice spotting of blood in your stool or on the toilet paper. If you underwent a bowel prep for your procedure, you may not have a normal bowel movement for a few days.  Please Note:  You might notice some irritation and congestion in your nose or some drainage.  This is from the oxygen used during your procedure.  There is no need for concern and it should clear up in a day or so.  SYMPTOMS TO REPORT IMMEDIATELY:   Following lower endoscopy (colonoscopy or flexible sigmoidoscopy):  Excessive amounts of blood in the stool  Significant tenderness or worsening of abdominal pains  Swelling of the abdomen that is new, acute  Fever of 100F or higher  For urgent or emergent issues, a gastroenterologist can be reached at any hour by calling [phone]. Do not use MyChart messaging for urgent concerns.    DIET:  We do recommend a small meal at first, but then you may proceed to your regular diet.   Drink plenty of fluids but you should avoid alcoholic beverages for 24 hours.  ACTIVITY:  You should plan to take it easy for the rest of today and you should NOT DRIVE or use heavy machinery until tomorrow (because of the sedation medicines used during the test).    FOLLOW UP: Our staff will call the number listed on your records 48-72 hours following your procedure to check on you and address any questions or concerns that you may have regarding the information given to you following your procedure. If we do not reach you, we will leave a message.  We will attempt to reach you two times.  During this call, we will ask if you have developed any symptoms of COVID 19. If you develop any symptoms (ie: fever, flu-like symptoms, shortness of breath, cough etc.) before then, please call [phone].  If you test positive for Covid 19 in the 2 weeks post procedure, please call and report this information to [place].    If any biopsies were taken you will be contacted by phone or by letter within the next 1-3 weeks.  Please call us at [phone] if you have not heard about the biopsies in 3 weeks.    SIGNATURES/CONFIDENTIALITY: You and/or your care partner have signed paperwork which will be entered into your electronic medical record.  These signatures attest to the fact that that the information above on your After Visit Summary has been reviewed  and is understood.  Full responsibility of the confidentiality of this discharge information lies with you and/or your care-partner.

## 2020-02-17 NOTE — Op Note (Addendum)
[facility] Patient Name: Natasha Rogers Procedure Date: 02/17/2020 9:22 AM MRN: [id number] Endoscopist: Thornton Park MD, MD Age: 59 Referring MD:  Date of Birth: 04/06/1960 Gender: Female Account #: [id number] Procedure:                Colonoscopy Indications:              Surveillance: Personal history of adenomatous                            polyps on last colonoscopy > 5 years ago                           Tubular adenoma on colonoscopy with Dr. Deatra Ina in                            2015                           Brother with colon polyps                           No known family history of colon cancer or polyps Medicines:                Monitored Anesthesia Care Procedure:                Pre-Anesthesia Assessment:                           - Prior to the procedure, a History and Physical                            was performed, and patient medications and                            allergies were reviewed. The patient's tolerance of                            previous anesthesia was also reviewed. The risks                            and benefits of the procedure and the sedation                            options and risks were discussed with the patient.                            All questions were answered, and informed consent                            was obtained. Prior Anticoagulants: The patient has                            taken no previous anticoagulant or antiplatelet  agents. ASA Grade Assessment: I - A normal, healthy                            patient. After reviewing the risks and benefits,                            the patient was deemed in satisfactory condition to                            undergo the procedure.                           After obtaining informed consent, the colonoscope                            was passed under direct vision. Throughout the                            procedure, the patient's  blood pressure, pulse, and                            oxygen saturations were monitored continuously. The                            Colonoscope was introduced through the anus and                            advanced to the 3 cm into the ileum. A second                            forward view of the right colon was performed. The                            colonoscopy was performed with moderate difficulty                            due to a redundant colon, significant looping and a                            tortuous colon. Successful completion of the                            procedure was aided by changing the patient's                            position, withdrawing and reinserting the scope and                            applying abdominal pressure. The patient tolerated                            the procedure well. The quality of the bowel  preparation was good. The terminal ileum, ileocecal                            valve, appendiceal orifice, and rectum were                            photographed. Scope In: 9:29:28 AM Scope Out: 9:46:43 AM Scope Withdrawal Time: 0 hours 9 minutes 32 seconds  Total Procedure Duration: 0 hours 17 minutes 15 seconds  Findings:                 The perianal and digital rectal examinations were                            normal.                           Multiple small and large-mouthed diverticula were                            found in the sigmoid colon and distal descending                            colon.                           A 2 mm polyp was found in the cecum. The polyp was                            flat. The polyp was removed with a cold snare.                            Resection and retrieval were complete. Estimated                            blood loss was minimal.                           The exam was otherwise without abnormality on                            direct and retroflexion  views. Complications:            No immediate complications. Estimated blood loss:                            Minimal. Estimated Blood Loss:     Estimated blood loss was minimal. Impression:               - Diverticulosis in the sigmoid colon and in the                            distal descending colon.                           - One 2 mm polyp in the cecum, removed with a cold  snare. Resected and retrieved.                           - The examination was otherwise normal on direct                            and retroflexion views. Recommendation:           - Patient has a contact number available for                            emergencies. The signs and symptoms of potential                            delayed complications were discussed with the                            patient. Return to normal activities tomorrow.                            Written discharge instructions were provided to the                            patient.                           - Follow a high fiber diet. Drink at least 64                            ounces of water daily. Add a daily stool bulking                            agent such as psyllium (an exampled would be                            Metamucil).                           - Continue present medications.                           - Await pathology results.                           - Repeat colonoscopy date to be determined after                            pending pathology results are reviewed for                            surveillance.                           - Emerging evidence supports eating a diet of  fruits, vegetables, grains, calcium, and yogurt                            while reducing red meat and alcohol may reduce the                            risk of colon cancer.                           - Thank you for allowing me to be involved in your                            colon  cancer prevention. Thornton Park MD, MD 02/17/2020 9:51:16 AM This report has been signed electronically.

## 2020-02-17 NOTE — Progress Notes (Signed)
Report given to PACU, vss 

## 2020-02-19 ENCOUNTER — Telehealth: Payer: Self-pay

## 2020-02-19 NOTE — Telephone Encounter (Signed)
  Follow up Call-  Call back number 02/17/2020  Post procedure Call Back phone  # [phone] hm  Permission to leave phone message Yes  Some recent data might be hidden     Patient questions:  Do you have a fever, pain , or abdominal swelling? No. Pain Score  0 *  Have you tolerated food without any problems? Yes.    Have you been able to return to your normal activities? Yes.    Do you have any questions about your discharge instructions: Diet   No. Medications  No. Follow up visit  No.  Do you have questions or concerns about your Care? No.  Actions: * If pain score is 4 or above: No action needed, pain <4.  Have you developed a fever since your procedure? No 2.   Have you had an respiratory symptoms (SOB or cough) since your procedure? No 3.   Have you tested positive for COVID 19 since your procedure No  4.   Have you had any family members/close contacts diagnosed with the COVID 19 since your procedure? No   If yes to any of these questions please route to Joylene John, RN and Joella Prince, RN

## 2020-02-21 ENCOUNTER — Encounter: Payer: Self-pay | Admitting: Gastroenterology

## 2021-01-25 ENCOUNTER — Other Ambulatory Visit: Payer: Self-pay | Admitting: Family Medicine

## 2021-01-25 DIAGNOSIS — Z1231 Encounter for screening mammogram for malignant neoplasm of breast: Secondary | ICD-10-CM

## 2021-02-26 ENCOUNTER — Ambulatory Visit
Admission: RE | Admit: 2021-02-26 | Discharge: 2021-02-26 | Disposition: A | Discharge status: Home / Self Care | Payer: BC Managed Care – PPO | Source: Ambulatory Visit | Attending: Family Medicine | Admitting: Family Medicine

## 2021-02-26 DIAGNOSIS — Z1231 Encounter for screening mammogram for malignant neoplasm of breast: Secondary | ICD-10-CM

## 2022-05-17 ENCOUNTER — Other Ambulatory Visit: Payer: Self-pay | Admitting: Family Medicine

## 2022-05-17 DIAGNOSIS — Z1231 Encounter for screening mammogram for malignant neoplasm of breast: Secondary | ICD-10-CM

## 2022-07-01 ENCOUNTER — Ambulatory Visit
Admission: RE | Admit: 2022-07-01 | Discharge: 2022-07-01 | Disposition: A | Discharge status: Home / Self Care | Payer: BC Managed Care – PPO | Source: Ambulatory Visit | Attending: Family Medicine | Admitting: Family Medicine

## 2022-07-01 DIAGNOSIS — Z1231 Encounter for screening mammogram for malignant neoplasm of breast: Secondary | ICD-10-CM

## 2023-04-01 IMAGING — MG MM DIGITAL SCREENING BILAT W/ TOMO AND CAD
8 series · 8 of 24 positions shown · non-contrast
Comparison: Previous exam(s).

CLINICAL DATA: Screening.

EXAM:
DIGITAL SCREENING BILATERAL MAMMOGRAM WITH TOMOSYNTHESIS AND CAD
TECHNIQUE: Bilateral screening digital craniocaudal and mediolateral oblique
mammograms were obtained. Bilateral screening digital breast
tomosynthesis was performed. The images were evaluated with
computer-aided detection.

[L CC synth-2D]
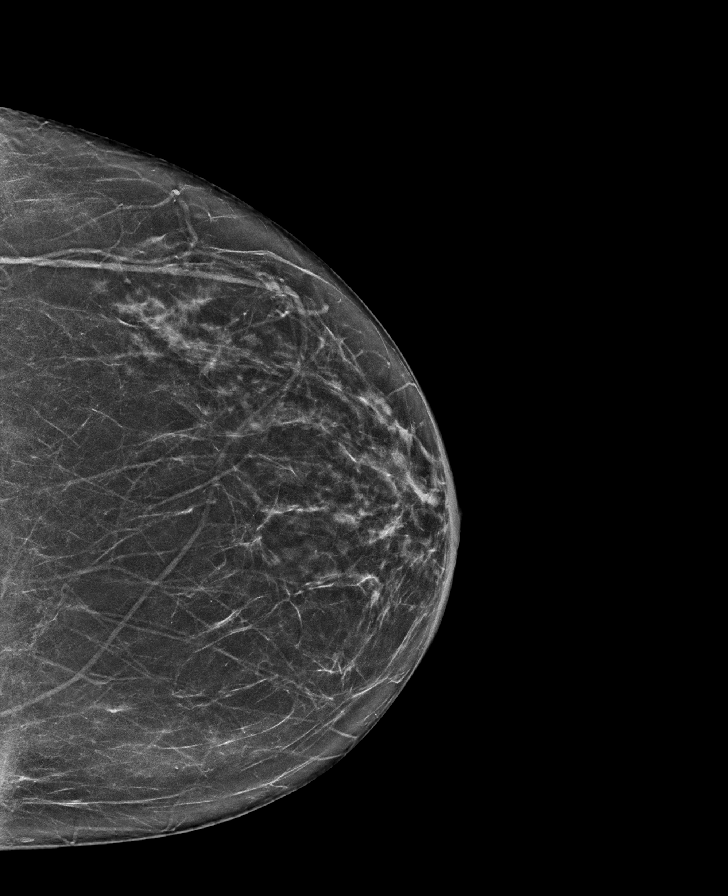

[L MLO synth-2D]
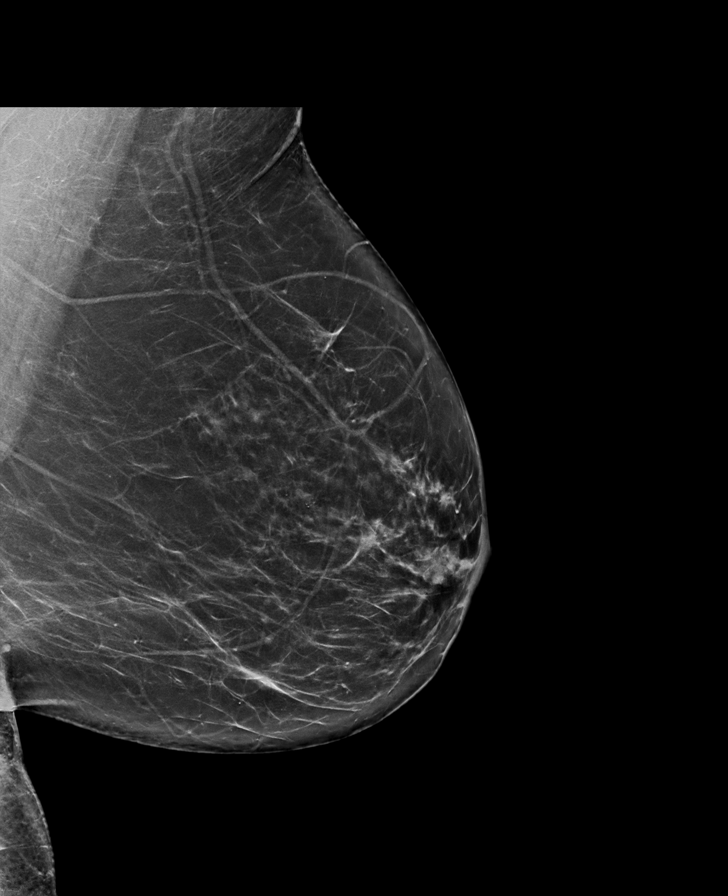

[R MLO synth-2D]
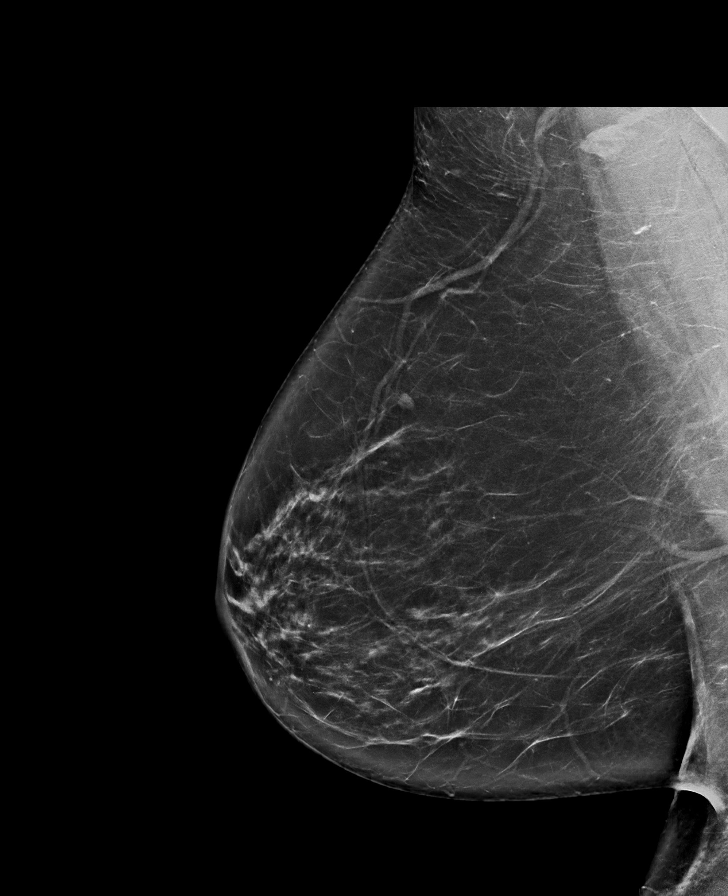

[R CC synth-2D]
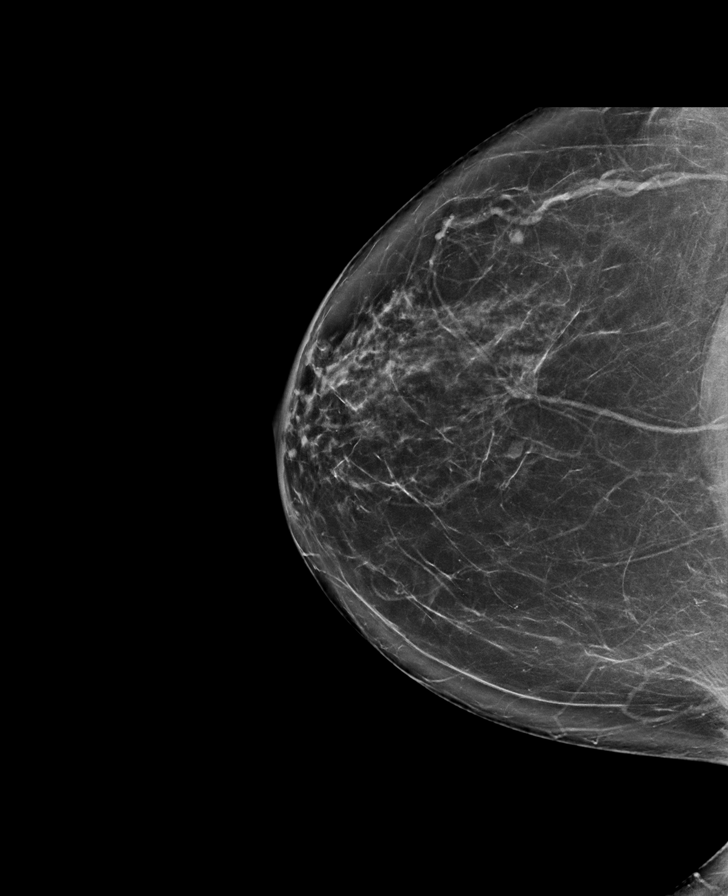

[R CC tomo · tomo slice 41/80.0]
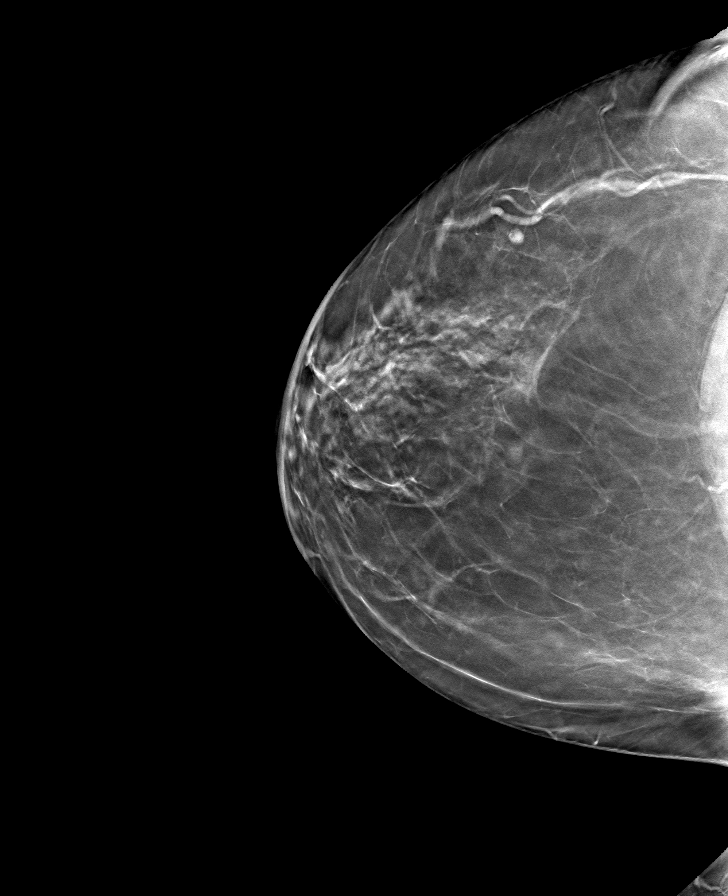

[L CC tomo · tomo slice 37/73.0]
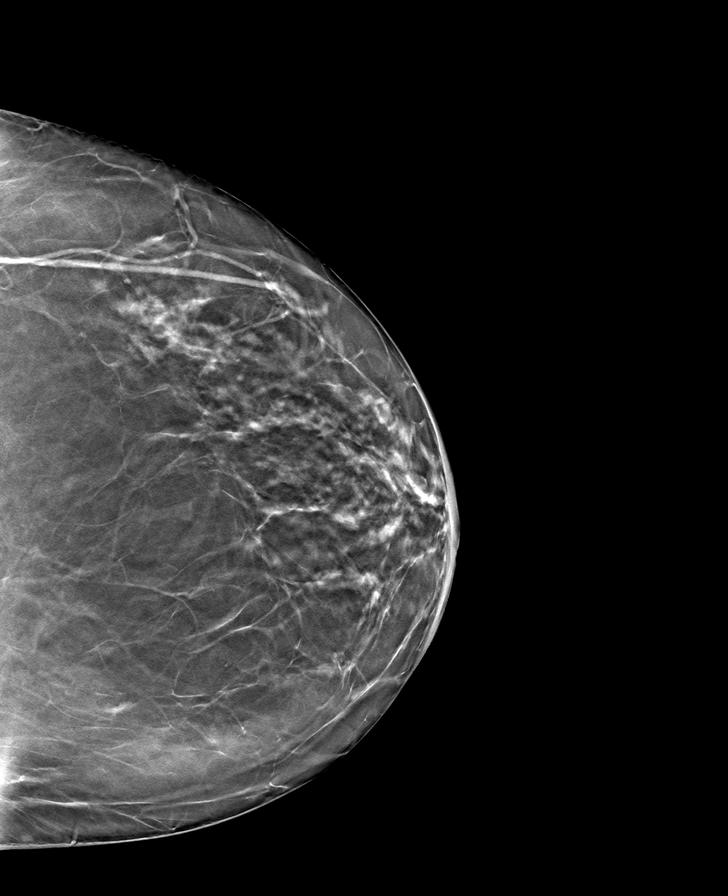

[R MLO tomo · tomo slice 43/86.0]
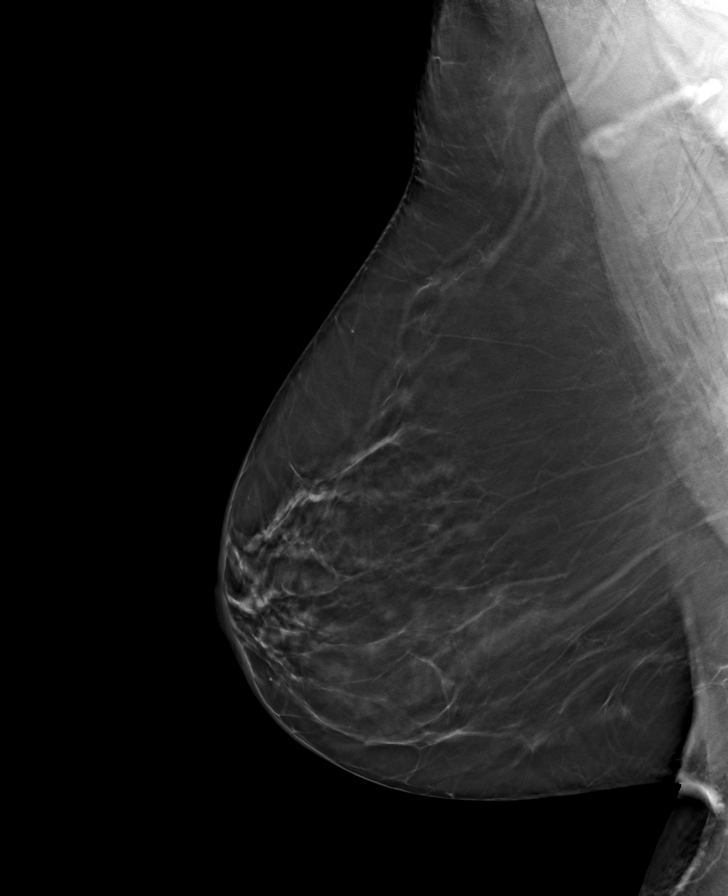

[L MLO tomo · tomo slice 43/84.0]
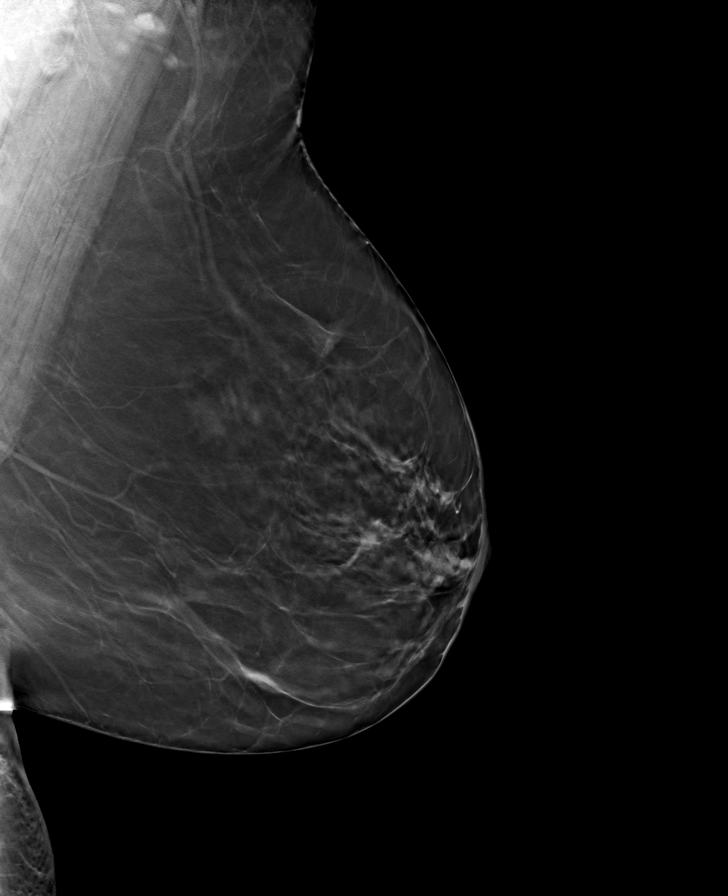

[8 of 24 positions shown; findings below may reference images not displayed]

ACR Breast Density Category b: There are scattered areas of
fibroglandular density.
FINDINGS: There are no findings suspicious for malignancy.
IMPRESSION: No mammographic evidence of malignancy. A result letter of this
screening mammogram will be mailed directly to the patient.

RECOMMENDATION:
Screening mammogram in one year. (Code:51-O-LD2)

BI-RADS CATEGORY  1: Negative.
